# Patient Record
Sex: Female | Born: 1970 | Race: Black or African American | Hispanic: No | Smoking: Never smoker
Health system: Southern US, Community
[De-identification: ages and names within clinical notes are randomized; demographics above are authoritative.]

## PROBLEM LIST (undated history)

## (undated) DIAGNOSIS — D649 Anemia, unspecified: Secondary | ICD-10-CM

---

## 2007-05-06 ENCOUNTER — Encounter: Admission: RE | Admit: 2007-05-06 | Discharge: 2007-05-06 | Payer: Self-pay | Admitting: Obstetrics and Gynecology

## 2007-05-12 ENCOUNTER — Encounter: Admission: RE | Admit: 2007-05-12 | Discharge: 2007-05-12 | Payer: Self-pay | Admitting: Interventional Radiology

## 2007-07-14 ENCOUNTER — Ambulatory Visit (HOSPITAL_COMMUNITY): Admission: RE | Admit: 2007-07-14 | Discharge: 2007-07-15 | Payer: Self-pay | Admitting: Interventional Radiology

## 2007-07-29 ENCOUNTER — Encounter: Admission: RE | Admit: 2007-07-29 | Discharge: 2007-07-29 | Payer: Self-pay | Admitting: Interventional Radiology

## 2008-02-02 ENCOUNTER — Encounter: Admission: RE | Admit: 2008-02-02 | Discharge: 2008-02-02 | Payer: Self-pay | Admitting: Interventional Radiology

## 2010-03-18 ENCOUNTER — Encounter: Payer: Self-pay | Admitting: Specialist

## 2010-03-18 ENCOUNTER — Encounter: Payer: Self-pay | Admitting: Interventional Radiology

## 2010-11-21 LAB — CBC
MCHC: 33.7
MCV: 92.4
Platelets: 254
RBC: 3.82 — ABNORMAL LOW
RDW: 14.5

## 2010-11-21 LAB — CREATININE, SERUM: GFR calc Af Amer: >60

## 2011-12-24 ENCOUNTER — Emergency Department (HOSPITAL_COMMUNITY)
Admission: EM | Admit: 2011-12-24 | Discharge: 2011-12-25 | Disposition: A | Discharge status: Home / Self Care | Payer: 59 | Attending: Emergency Medicine | Admitting: Emergency Medicine

## 2011-12-24 DIAGNOSIS — Z862 Personal history of diseases of the blood and blood-forming organs and certain disorders involving the immune mechanism: Secondary | ICD-10-CM | POA: Insufficient documentation

## 2011-12-24 DIAGNOSIS — R51 Headache: Secondary | ICD-10-CM

## 2011-12-24 DIAGNOSIS — R202 Paresthesia of skin: Secondary | ICD-10-CM

## 2011-12-24 DIAGNOSIS — R209 Unspecified disturbances of skin sensation: Secondary | ICD-10-CM | POA: Insufficient documentation

## 2011-12-24 HISTORY — DX: Anemia, unspecified: D64.9

## 2011-12-25 ENCOUNTER — Emergency Department (HOSPITAL_COMMUNITY): Payer: 59

## 2011-12-25 ENCOUNTER — Encounter (HOSPITAL_COMMUNITY): Payer: Self-pay | Admitting: *Deleted

## 2011-12-25 MED ORDER — SODIUM CHLORIDE 0.9 % IV BOLUS (SEPSIS)
1000.0000 mL | Freq: Once | INTRAVENOUS | Status: AC
  Administered 2011-12-25: 1000 mL via INTRAVENOUS

## 2011-12-25 MED ORDER — METHYLPREDNISOLONE SODIUM SUCC 125 MG IJ SOLR
125.0000 mg | Freq: Once | INTRAMUSCULAR | Status: AC
  Administered 2011-12-25: 125 mg via INTRAVENOUS
  Filled 2011-12-25: qty 2

## 2011-12-25 MED ORDER — DIPHENHYDRAMINE HCL 50 MG/ML IJ SOLN
25.0000 mg | Freq: Once | INTRAMUSCULAR | Status: AC
  Administered 2011-12-25: 25 mg via INTRAVENOUS
  Filled 2011-12-25: qty 1

## 2011-12-25 MED ORDER — METHYLPREDNISOLONE 4 MG PO KIT: PACK | ORAL | Status: AC

## 2011-12-25 MED ORDER — KETOROLAC TROMETHAMINE 30 MG/ML IJ SOLN
30.0000 mg | Freq: Once | INTRAMUSCULAR | Status: AC
  Administered 2011-12-25: 30 mg via INTRAVENOUS
  Filled 2011-12-25: qty 1

## 2011-12-25 NOTE — ED Notes (Signed)
MD at bedside. 

## 2011-12-25 NOTE — ED Provider Notes (Signed)
History     CSN: [id number]  Arrival date & time 12/24/11  2350   First MD Initiated Contact with Patient 12/25/11 0029      Chief Complaint  Patient presents with  . Headache    (Consider location/radiation/quality/duration/timing/severity/associated sxs/prior treatment) HPI Hx per PT. HA x 2 days with on and off R side tingling to hand, but also somewhat to her face and foot on the right, has h/o carpal tunnel, no neck pain, no trouble walking or speaking, no h/o MS. This tingling comes and goes multiple times throughout the last few days. No h/o same. No recent lifting or more strenuous activity. No rash, weakness, or fevers.  Mod in severity.  HA located all over. Dull in quality, 5-6/10 at its worse minimal now. PT had her BP checked at work, was told it was high and referred here for further evaluation, is followed by [facility] in [facility], has never had elevated BP, she is very worried about this.   Past Medical History  Diagnosis Date  . Anemia     History reviewed. No pertinent past surgical history.  No family history on file.  History  Substance Use Topics  . Smoking status: Never Smoker   . Smokeless tobacco: Not on file  . Alcohol Use: No    OB History    Grav Para Term Preterm Abortions TAB SAB Ect Mult Living                  Review of Systems  Constitutional: Negative for fever and chills.  HENT: Negative for neck pain and neck stiffness.   Eyes: Negative for visual disturbance.  Respiratory: Negative for shortness of breath.   Cardiovascular: Negative for chest pain.  Gastrointestinal: Negative for abdominal pain.  Genitourinary: Negative for dysuria.  Musculoskeletal: Negative for back pain.  Skin: Negative for rash.  Neurological: Positive for numbness and headaches. Negative for seizures and speech difficulty.  All other systems reviewed and are negative.    Allergies  Aspirin  Home Medications   Current Outpatient Rx  Name Route Sig  Dispense Refill  . ZOLPIDEM TARTRATE 10 MG PO TABS Oral Take 10 mg by mouth at bedtime as needed. For sleep      BP 159/102  Pulse 86  Temp 98 F (36.7 C)  Resp 20  SpO2 100%  LMP 12/22/2011  Physical Exam  Constitutional: She is oriented to person, place, and time. She appears well-developed and well-nourished.  HENT:  Head: Normocephalic and atraumatic.  Eyes: Conjunctivae normal and EOM are normal. Pupils are equal, round, and reactive to light.  Neck: Full passive range of motion without pain. Neck supple. No thyromegaly present.       No midline tenderness or meningismus  Cardiovascular: Normal rate, regular rhythm, S1 normal, S2 normal and intact distal pulses.   Pulmonary/Chest: Effort normal and breath sounds normal.  Abdominal: Soft. Bowel sounds are normal. There is no tenderness. There is no CVA tenderness.  Musculoskeletal: Normal range of motion.       No C/T/L spine tenderness  Neurological: She is alert and oriented to person, place, and time. She has normal strength and normal reflexes. She displays normal reflexes. No cranial nerve deficit or sensory deficit. She exhibits normal muscle tone. She displays a negative Romberg sign. Coordination normal. GCS eye subscore is 4. GCS verbal subscore is 5. GCS motor subscore is 6.       Normal Gait. Sensorium to light touch equal and  intact throughout.   Skin: Skin is warm and dry. No rash noted. No cyanosis. Nails show no clubbing.  Psychiatric: She has a normal mood and affect. Her speech is normal and behavior is normal.    ED Course  Procedures (including critical care time)  Ct Head Wo Contrast  12/25/2011  *RADIOLOGY REPORT*  Clinical Data: Headache.  Tingling on the right side of her body.  CT HEAD WITHOUT CONTRAST  Technique:  Contiguous axial images were obtained from the base of the skull through the vertex without contrast.  Comparison: None.  Findings: No acute intracranial abnormality is present. Specifically,  there is no evidence for acute infarct, hemorrhage, mass, hydrocephalus, or extra-axial fluid collection.  The paranasal sinuses and mastoid air cells are clear.  The globes and orbits are intact.  The osseous skull is intact.  IMPRESSION: Negative CT of the head.   Original Report Authenticated By: Jamesetta Orleans. MATTERN, M.D.    IVFs. IV toradol for HA  On recheck mild itching and erythematous rash - was given steroids and benadryl  3:28 AM HA and rash and itching resolved.  CT reviewed. No change normal neuro exam. BP unchanged. Plan close PCP follow up for recheck BP and symptoms. Presentation does not suggest stroke. Intermittent symptoms with tingling mostly R hand more c/w carpal tunnel.    MDM   HA and elevated BP. CT scan. VS and nursing notes reviewed. IVFs and toradol for HA. Mild allergic reaction to toradol  VS and nursing notes reviewed. Improved condition - stable for d/c home and outpatient follow up      Sunnie Nielsen, MD 12/27/11 0116

## 2011-12-25 NOTE — ED Notes (Signed)
Pt reports hx of carpal tunnel syndrome in past.

## 2011-12-25 NOTE — ED Notes (Signed)
Pt reported having red bumps on the side of her face.  Upon inspection, pt did have small red bumps.  Pt report itching.  MD made aware and pt given medication.

## 2011-12-25 NOTE — ED Notes (Signed)
Pt c/o dull headache x 2 days; tingling sensation on right side of body x 2 days

## 2016-02-02 ENCOUNTER — Ambulatory Visit (INDEPENDENT_AMBULATORY_CARE_PROVIDER_SITE_OTHER): Payer: 59

## 2016-02-02 ENCOUNTER — Ambulatory Visit (INDEPENDENT_AMBULATORY_CARE_PROVIDER_SITE_OTHER): Payer: 59 | Admitting: Podiatry

## 2016-02-02 DIAGNOSIS — M7662 Achilles tendinitis, left leg: Secondary | ICD-10-CM

## 2016-02-02 DIAGNOSIS — M722 Plantar fascial fibromatosis: Secondary | ICD-10-CM

## 2016-02-02 MED ORDER — DICLOFENAC SODIUM 75 MG PO TBEC: 75.0000 mg | DELAYED_RELEASE_TABLET | Freq: Two times a day (BID) | ORAL | 2 refills | Status: AC

## 2016-02-02 NOTE — Patient Instructions (Signed)

## 2016-02-02 NOTE — Progress Notes (Signed)
   Subjective:    Patient ID: Margaret Wilson, female    DOB: 02/02/1971, 45 y.o.   MRN: [id number]  HPI    Review of Systems  All other systems reviewed and are negative.      Objective:   Physical Exam        Assessment & Plan:

## 2016-02-04 NOTE — Progress Notes (Signed)
Subjective:     Patient ID: Margaret Wilson, female   DOB: 05/31/1970, 45 y.o.   MRN: [id number]  HPI patient presents with 2 problems with one being discomfort in the right plantar arch mid arch area and on the left there is a small area of reactivity on the posterior aspect left heel that is not tender but she's concerned about appearance   Review of Systems  All other systems reviewed and are negative.      Objective:   Physical Exam  Constitutional: She is oriented to person, place, and time.  Cardiovascular: Intact distal pulses.   Musculoskeletal: Normal range of motion.  Neurological: She is oriented to person, place, and time.  Skin: Skin is warm.  Nursing note and vitals reviewed.  neurovascular status intact muscle strength adequate range of motion within normal limits with patient found to have discoloration of the left posterior heel within the insertion point of the calcaneus lateral side and pain in the right mid arch area with moderate depression of the arch noted. Patient's found have good digital perfusion and is well oriented 3     Assessment:     Reactivity left posterior heel which may be related to spur but not painful and fasciitis right    Plan:     H&P conditions reviewed and at this time reviewed x-rays. I then do not recommend treatment left except for silicone padding which was dispensed and for the right I dispensed fascial brace to lift the arch with instructions for physical therapy. If symptoms were to get worse we will consider treatment but I do not recommend surgery for the left and less it were to become painful  X-ray report indicated that there is spurring of the posterior left heel for mild nature and mild depression of the arch with no signs stress fracture arthritis

## 2017-01-08 ENCOUNTER — Other Ambulatory Visit: Payer: Self-pay | Admitting: Family Medicine

## 2017-01-08 DIAGNOSIS — Z139 Encounter for screening, unspecified: Secondary | ICD-10-CM

## 2017-02-11 ENCOUNTER — Ambulatory Visit
Admission: RE | Admit: 2017-02-11 | Discharge: 2017-02-11 | Disposition: A | Discharge status: Home / Self Care | Payer: 59 | Source: Ambulatory Visit | Attending: Family Medicine | Admitting: Family Medicine

## 2017-02-11 DIAGNOSIS — Z139 Encounter for screening, unspecified: Secondary | ICD-10-CM

## 2018-04-01 ENCOUNTER — Other Ambulatory Visit: Payer: Self-pay | Admitting: Family Medicine

## 2018-04-01 DIAGNOSIS — Z1231 Encounter for screening mammogram for malignant neoplasm of breast: Secondary | ICD-10-CM

## 2018-04-28 ENCOUNTER — Ambulatory Visit
Admission: RE | Admit: 2018-04-28 | Discharge: 2018-04-28 | Disposition: A | Discharge status: Home / Self Care | Payer: 59 | Source: Ambulatory Visit | Attending: Family Medicine | Admitting: Family Medicine

## 2018-04-28 ENCOUNTER — Encounter: Payer: Self-pay | Admitting: Radiology

## 2018-04-28 DIAGNOSIS — Z1231 Encounter for screening mammogram for malignant neoplasm of breast: Secondary | ICD-10-CM

## 2019-03-01 ENCOUNTER — Ambulatory Visit: Payer: 59 | Attending: Internal Medicine

## 2019-03-01 DIAGNOSIS — Z20822 Contact with and (suspected) exposure to covid-19: Secondary | ICD-10-CM

## 2019-03-02 LAB — NOVEL CORONAVIRUS, NAA: SARS-CoV-2, NAA: DETECTED — AB

## 2019-03-23 ENCOUNTER — Other Ambulatory Visit: Payer: Self-pay | Admitting: Family Medicine

## 2019-03-23 DIAGNOSIS — Z1231 Encounter for screening mammogram for malignant neoplasm of breast: Secondary | ICD-10-CM

## 2019-04-30 ENCOUNTER — Other Ambulatory Visit: Payer: Self-pay

## 2019-04-30 ENCOUNTER — Ambulatory Visit
Admission: RE | Admit: 2019-04-30 | Discharge: 2019-04-30 | Disposition: A | Discharge status: Home / Self Care | Payer: 59 | Source: Ambulatory Visit | Attending: Family Medicine | Admitting: Family Medicine

## 2019-04-30 DIAGNOSIS — Z1231 Encounter for screening mammogram for malignant neoplasm of breast: Secondary | ICD-10-CM

## 2020-03-28 ENCOUNTER — Other Ambulatory Visit: Payer: Self-pay | Admitting: Family Medicine

## 2020-03-28 DIAGNOSIS — Z1231 Encounter for screening mammogram for malignant neoplasm of breast: Secondary | ICD-10-CM

## 2020-05-11 ENCOUNTER — Inpatient Hospital Stay: Admission: RE | Admit: 2020-05-11 | Payer: 59 | Source: Ambulatory Visit

## 2020-06-30 ENCOUNTER — Ambulatory Visit
Admission: RE | Admit: 2020-06-30 | Discharge: 2020-06-30 | Disposition: A | Discharge status: Home / Self Care | Payer: 59 | Source: Ambulatory Visit | Attending: Family Medicine | Admitting: Family Medicine

## 2020-06-30 ENCOUNTER — Other Ambulatory Visit: Payer: Self-pay

## 2020-06-30 DIAGNOSIS — Z1231 Encounter for screening mammogram for malignant neoplasm of breast: Secondary | ICD-10-CM

## 2020-07-03 ENCOUNTER — Other Ambulatory Visit: Payer: Self-pay | Admitting: Family Medicine

## 2020-07-03 DIAGNOSIS — R928 Other abnormal and inconclusive findings on diagnostic imaging of breast: Secondary | ICD-10-CM

## 2020-07-21 ENCOUNTER — Ambulatory Visit: Payer: 59

## 2020-07-21 ENCOUNTER — Other Ambulatory Visit: Payer: Self-pay

## 2020-07-21 ENCOUNTER — Ambulatory Visit
Admission: RE | Admit: 2020-07-21 | Discharge: 2020-07-21 | Disposition: A | Discharge status: Home / Self Care | Payer: 59 | Source: Ambulatory Visit | Attending: Family Medicine | Admitting: Family Medicine

## 2020-07-21 DIAGNOSIS — R928 Other abnormal and inconclusive findings on diagnostic imaging of breast: Secondary | ICD-10-CM

## 2021-06-21 ENCOUNTER — Other Ambulatory Visit: Payer: Self-pay | Admitting: Family Medicine

## 2021-06-21 DIAGNOSIS — Z1231 Encounter for screening mammogram for malignant neoplasm of breast: Secondary | ICD-10-CM

## 2021-07-02 ENCOUNTER — Ambulatory Visit
Admission: RE | Admit: 2021-07-02 | Discharge: 2021-07-02 | Disposition: A | Discharge status: Home / Self Care | Payer: 59 | Source: Ambulatory Visit | Attending: Family Medicine | Admitting: Family Medicine

## 2021-07-02 DIAGNOSIS — Z1231 Encounter for screening mammogram for malignant neoplasm of breast: Secondary | ICD-10-CM

## 2022-02-18 ENCOUNTER — Other Ambulatory Visit: Payer: Self-pay

## 2022-02-18 ENCOUNTER — Emergency Department (HOSPITAL_BASED_OUTPATIENT_CLINIC_OR_DEPARTMENT_OTHER)
Admission: EM | Admit: 2022-02-18 | Discharge: 2022-02-18 | Disposition: A | Discharge status: Home / Self Care | Payer: 59 | Attending: Emergency Medicine | Admitting: Emergency Medicine

## 2022-02-18 DIAGNOSIS — J101 Influenza due to other identified influenza virus with other respiratory manifestations: Secondary | ICD-10-CM | POA: Diagnosis not present

## 2022-02-18 DIAGNOSIS — D72810 Lymphocytopenia: Secondary | ICD-10-CM | POA: Diagnosis not present

## 2022-02-18 DIAGNOSIS — R55 Syncope and collapse: Secondary | ICD-10-CM

## 2022-02-18 DIAGNOSIS — I10 Essential (primary) hypertension: Secondary | ICD-10-CM | POA: Diagnosis not present

## 2022-02-18 DIAGNOSIS — U071 COVID-19: Secondary | ICD-10-CM | POA: Insufficient documentation

## 2022-02-18 LAB — CBC
HCT: 34.2 % — ABNORMAL LOW (ref 36.0–46.0)
Hemoglobin: 11.4 g/dL — ABNORMAL LOW (ref 12.0–15.0)
MCH: 30.7 pg (ref 26.0–34.0)
MCHC: 33.3 g/dL (ref 30.0–36.0)
MCV: 92.2 fL (ref 80.0–100.0)
Platelets: 179 10*3/uL (ref 150–400)
RBC: 3.71 MIL/uL — ABNORMAL LOW (ref 3.87–5.11)
RDW: 13.1 % (ref 11.5–15.5)
WBC: 2.9 10*3/uL — ABNORMAL LOW (ref 4.0–10.5)
nRBC: 0 % (ref 0.0–0.2)

## 2022-02-18 LAB — URINALYSIS, ROUTINE W REFLEX MICROSCOPIC
Bilirubin Urine: NEGATIVE
Glucose, UA: NEGATIVE mg/dL
Hgb urine dipstick: NEGATIVE
Ketones, ur: NEGATIVE mg/dL
Leukocytes,Ua: NEGATIVE
Nitrite: NEGATIVE
Protein, ur: NEGATIVE mg/dL
Specific Gravity, Urine: 1.017 (ref 1.005–1.030)
pH: 6.5 (ref 5.0–8.0)

## 2022-02-18 LAB — COMPREHENSIVE METABOLIC PANEL
ALT: 20 U/L (ref 0–44)
AST: 26 U/L (ref 15–41)
Albumin: 4 g/dL (ref 3.5–5.0)
Alkaline Phosphatase: 39 U/L (ref 38–126)
Anion gap: 10 (ref 5–15)
BUN: 12 mg/dL (ref 6–20)
CO2: 28 mmol/L (ref 22–32)
Calcium: 8.5 mg/dL — ABNORMAL LOW (ref 8.9–10.3)
Chloride: 104 mmol/L (ref 98–111)
Creatinine, Ser: 0.65 mg/dL (ref 0.44–1.00)
GFR, Estimated: >60 mL/min (ref >60)
Glucose, Bld: 92 mg/dL (ref 70–99)
Potassium: 3.4 mmol/L — ABNORMAL LOW (ref 3.5–5.1)
Sodium: 142 mmol/L (ref 135–145)
Total Bilirubin: 0.3 mg/dL (ref 0.3–1.2)
Total Protein: 6.9 g/dL (ref 6.5–8.1)

## 2022-02-18 LAB — RESP PANEL BY RT-PCR (RSV, FLU A&B, COVID)  RVPGX2
Influenza A by PCR: POSITIVE — AB
Influenza B by PCR: NEGATIVE
Resp Syncytial Virus by PCR: NEGATIVE
SARS Coronavirus 2 by RT PCR: POSITIVE — AB

## 2022-02-18 LAB — TROPONIN I (HIGH SENSITIVITY): Troponin I (High Sensitivity): <2 ng/L (ref <18)

## 2022-02-18 MED ORDER — LACTATED RINGERS IV BOLUS
1000.0000 mL | Freq: Once | INTRAVENOUS | Status: AC
  Administered 2022-02-18: 1000 mL via INTRAVENOUS

## 2022-02-18 MED ORDER — KETOROLAC TROMETHAMINE 15 MG/ML IJ SOLN
15.0000 mg | Freq: Once | INTRAMUSCULAR | Status: AC
  Administered 2022-02-18: 15 mg via INTRAVENOUS
  Filled 2022-02-18: qty 1

## 2022-02-18 MED ORDER — ONDANSETRON HCL 4 MG/2ML IJ SOLN: 4.0000 mg | Freq: Once | INTRAMUSCULAR | Status: DC

## 2022-02-18 MED ORDER — POTASSIUM CHLORIDE CRYS ER 20 MEQ PO TBCR
40.0000 meq | EXTENDED_RELEASE_TABLET | Freq: Once | ORAL | Status: AC
  Administered 2022-02-18: 40 meq via ORAL
  Filled 2022-02-18: qty 2

## 2022-02-18 NOTE — ED Provider Notes (Signed)
[facility] EMERGENCY DEPT Provider Note   CSN: [id number] Arrival date & time: 02/18/22  1572     History  Chief Complaint  Patient presents with   Loss of Consciousness   Hypotension    Margaret Wilson is a 51 y.o. female.  With PMH of HTN, HLD, anemia who was brought in by EMS today after having a syncopal episode noted to have positive orthostats with EMS given 500 cc IVF.  Patient noted getting up this morning and walking around her home but then feeling very lightheaded and unwell and like she was going to faint and then fell to the ground.  Had short episode of loss of consciousness but no head injury.  She is denying any headache or neck pain now.  Denies any focal weakness, no numbness or tingling, no loss of sensation, no slurred speech.  She had no seizure-like activity witnessed by family member at bedside, no loss of bladder or bowels, no tongue bites.  She just notes over the past couple of days she has been having sore throat, dry cough, body aches and fevers.  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  She has had decreased p.o. intake in the setting of this illness.  She has had no vomiting or diarrhea, no melena or hematochezia.  No chest pain, no abdominal pain, no palpitations or shortness of breath..   Loss of Consciousness      Home Medications Prior to Admission medications   Medication Sig Start Date End Date Taking? Authorizing Provider  diclofenac (VOLTAREN) 75 MG EC tablet Take 1 tablet (75 mg total) by mouth 2 (two) times daily. 02/02/16   Lenn Sink, DPM  methylPREDNISolone (MEDROL DOSEPAK) 4 MG tablet follow package directions 12/25/11   Sunnie Nielsen, MD  zolpidem (AMBIEN) 10 MG tablet Take 10 mg by mouth at bedtime as needed. For sleep    [provider]      Allergies    Aspirin    Review of Systems   Review of Systems  Cardiovascular:   Positive for syncope.    Physical Exam Updated Vital Signs BP 133/87   Pulse 69   Temp 98.8 F (37.1 C) (Oral)   Resp 14   Ht 5\' 2"  (1.575 m)   Wt 54.4 kg   LMP 12/22/2011   SpO2 97%   BMI 21.95 kg/m  Physical Exam Constitutional: Alert and oriented. Well appearing and in no distress. Eyes: Conjunctivae are normal. ENT      Head: Normocephalic and atraumatic.      Nose: No congestion.      Mouth/Throat: Mucous membranes are moist.  No tongue bites      Neck: No stridor.  No midline tenderness step-offs or deformities Cardiovascular: S1, S2,  Normal and symmetric distal pulses are present in all extremities.Warm and well perfused. Respiratory: Normal respiratory effort. Breath sounds are normal.  O2 sat 100 on RA Gastrointestinal: Soft and nontender.  Musculoskeletal: Normal range of motion in all extremities.      Right lower leg: No tenderness or edema.      Left lower leg: No tenderness or edema. Neurologic: Normal speech and language.  CN II through XII grossly intact.  5 out of 5 strength bilateral upper and lower extremities.  Normal finger-to-nose.  Sensation grossly intact.  No gross focal neurologic deficits are appreciated. Skin: Skin is warm, dry and  intact. No rash noted. Psychiatric: Mood and affect are normal. Speech and behavior are normal.  ED Results / Procedures / Treatments   Labs (all labs ordered are listed, but only abnormal results are displayed) Labs Reviewed  RESP PANEL BY RT-PCR (RSV, FLU A&B, COVID)  RVPGX2 - Abnormal; Notable for the following components:      Result Value   SARS Coronavirus 2 by RT PCR POSITIVE (*)    Influenza A by PCR POSITIVE (*)    All other components within normal limits  CBC - Abnormal; Notable for the following components:   WBC 2.9 (*)    RBC 3.71 (*)    Hemoglobin 11.4 (*)    HCT 34.2 (*)    All other components within normal limits  COMPREHENSIVE METABOLIC PANEL - Abnormal; Notable for the following components:    Potassium 3.4 (*)    Calcium 8.5 (*)    All other components within normal limits  URINALYSIS, ROUTINE W REFLEX MICROSCOPIC  CBG MONITORING, ED  TROPONIN I (HIGH SENSITIVITY)    EKG EKG Interpretation  Date/Time:  Monday February 18 2022 08:39:54 EST Ventricular Rate:  68 PR Interval:  149 QRS Duration: 84 QT Interval:  375 QTC Calculation: 399 R Axis:   63 Text Interpretation: Sinus rhythm Low voltage, precordial leads Probable anteroseptal infarct, old Confirmed by Vivien Rossetti ([id number]) on 02/18/2022 8:47:04 AM  Radiology No results found.  Procedures Procedures  Remain on constant cardiac monitoring normal sinus rhythm with normal rates.  Medications Ordered in ED Medications  potassium chloride SA (KLOR-CON M) CR tablet 40 mEq (has no administration in time range)  lactated ringers bolus 1,000 mL (0 mLs Intravenous Stopped 02/18/22 1100)  ketorolac (TORADOL) 15 MG/ML injection 15 mg (15 mg Intravenous Given 02/18/22 8299)    ED Course/ Medical Decision Making/ A&P                           Medical Decision Making Laquenta P Howser is a 51 y.o. female.  With PMH of HTN, HLD, anemia who was brought in by EMS today after having a syncopal episode noted to have positive orthostats with EMS given 500 cc IVF.  Patient noted getting up this morning and walking around her home but then feeling very lightheaded and unwell and like she was going to faint and then fell to the ground.  Based on patient's history and presentation with decreased p.o. intake in the setting of suspected viral illness, presentation seems most consistent with orthostatic episode and nonconcerning cause of syncope.  She additionally had positive orthostats with EMS and feels improved with IV fluids.  She had no head injury and has had no focal neurologic deficits on exam and no confusion.  No tongue bites no seizure-like activity no loss of bladder or bowels, do not think she had a seizure  today.  CT Head Unnecessary The [place] Head CT Rule suggests a head CT is not necessary for this patient (sensitivity 83-100% for all intracranial traumatic findings, sensitivity 100% for findings requiring neurosurgical intervention).  EKG obtained which I personally reviewed which was normal sinus rhythm with normal rate normal QTc 399 and no ARVD or WPW.  Troponin undetected, unlikely ACS.  Especially with no cardiopulmonary symptoms.  Labs obtained which I personally reviewed.  Mild lymphopenia 2.9 likely in the setting of viral illnesses patient tested positive for both COVID and influenza today.  Her hemoglobin is 11.4 at baseline.  No complaints of bleeding.  Mild hypokalemia 3.4 repleted in ED.  Creatinine 0.65 within normal limits.  UA without evidence of dehydration or UTI.  Patient ambulated multiple times without any further episodes.  Her hemodynamics are stable and she has no hypoxia or increased work of breathing or findings concerning for severe COVID/flu infection requiring any further workup.  She feels safe with discharge home and advised continued p.o. hydration, Tylenol ibuprofen as needed for fevers or aches and close follow-up with PCP with return precautions.  She is in agreement with plan and safe for discharge.  Amount and/or Complexity of Data Reviewed Labs: ordered.  Risk Prescription drug management.    Final Clinical Impression(s) / ED Diagnoses Final diagnoses:  Syncope, unspecified syncope type  COVID-19  Influenza A    Rx / DC Orders ED Discharge Orders     None         Mardene Sayer, MD 02/18/22 1116

## 2022-02-18 NOTE — ED Triage Notes (Signed)
Patient arrives via EMS from home with complaints of having a syncopal episode. The was patient was walking around her home, when she fainted. EMS arrived to her home and she was alert and oriented x4, but having orthostatic changes in her blood pressure.   Patient initial bp was 106/70, but decreased to 84/60. When ambulating.   CBG 132 70 HR (Normal sinus).  Given 500 CC of IV fluid en route.  Reports some back pain on arrival. Rates pain a 4/10.

## 2022-02-18 NOTE — Discharge Instructions (Addendum)
You have been seen today in the Emergency Department (ED)  for syncope (passing out).  Your workup including labs and EKG show reassuring results.  Your symptoms may be due to dehydration, so it is important that you drink plenty of non-alcoholic fluids. It is also important to avoid drugs and alcohol.  You also tested positive for influenza and COVID which are contributing to your symptoms and feeling unwell.  Take Tylenol and ibuprofen as needed for pain or fever.  Please call your regular doctor as soon as possible to schedule the next available clinic appointment to follow up with him/her regarding your visit to the ED and your symptoms. You should ideally follow up with your primary doctor within one week.  Return to the Emergency Department (ED)  if you have any further syncopal episodes (pass out again) or develop ANY chest pain, pressure, tightness, palpitations, trouble breathing, sudden sweating, or other symptoms that concern you.

## 2022-05-20 ENCOUNTER — Other Ambulatory Visit: Payer: Self-pay | Admitting: Family Medicine

## 2022-05-20 DIAGNOSIS — Z1231 Encounter for screening mammogram for malignant neoplasm of breast: Secondary | ICD-10-CM

## 2022-07-04 ENCOUNTER — Ambulatory Visit
Admission: RE | Admit: 2022-07-04 | Discharge: 2022-07-04 | Disposition: A | Discharge status: Home / Self Care | Payer: 59 | Source: Ambulatory Visit | Attending: Family Medicine | Admitting: Family Medicine

## 2022-07-04 DIAGNOSIS — Z1231 Encounter for screening mammogram for malignant neoplasm of breast: Secondary | ICD-10-CM

## 2023-05-22 ENCOUNTER — Other Ambulatory Visit: Payer: Self-pay | Admitting: Family Medicine

## 2023-05-22 DIAGNOSIS — Z0000 Encounter for general adult medical examination without abnormal findings: Secondary | ICD-10-CM

## 2023-07-08 ENCOUNTER — Ambulatory Visit
Admission: RE | Admit: 2023-07-08 | Discharge: 2023-07-08 | Disposition: A | Discharge status: Home / Self Care | Source: Ambulatory Visit | Attending: Family Medicine | Admitting: Family Medicine

## 2023-07-08 DIAGNOSIS — Z0000 Encounter for general adult medical examination without abnormal findings: Secondary | ICD-10-CM

## 2023-08-28 IMAGING — MG MM DIGITAL SCREENING BILAT W/ TOMO AND CAD
8 series · 9 of 24 positions shown · non-contrast
Comparison: Previous exam(s).

CLINICAL DATA: Screening.

EXAM:
DIGITAL SCREENING BILATERAL MAMMOGRAM WITH TOMOSYNTHESIS AND CAD
TECHNIQUE: Bilateral screening digital craniocaudal and mediolateral oblique
mammograms were obtained. Bilateral screening digital breast
tomosynthesis was performed. The images were evaluated with
computer-aided detection.

[R MLO synth-2D]
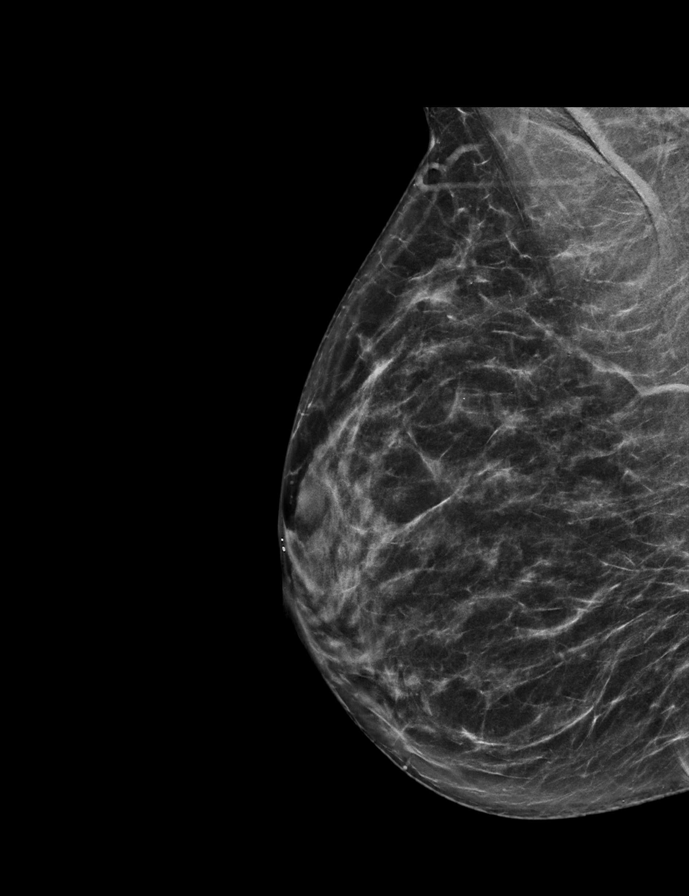

[L MLO synth-2D]
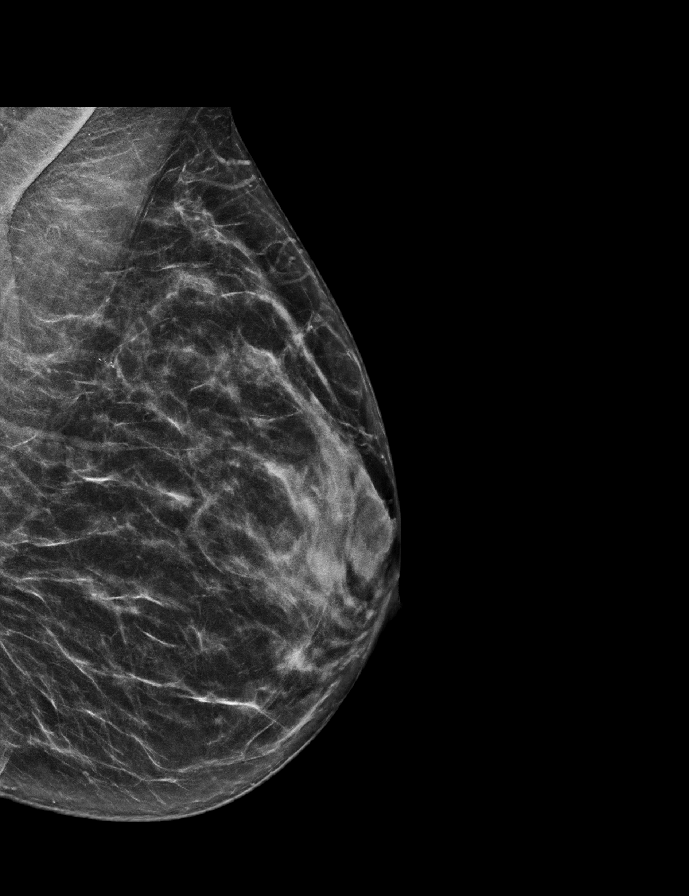

[L CC synth-2D]
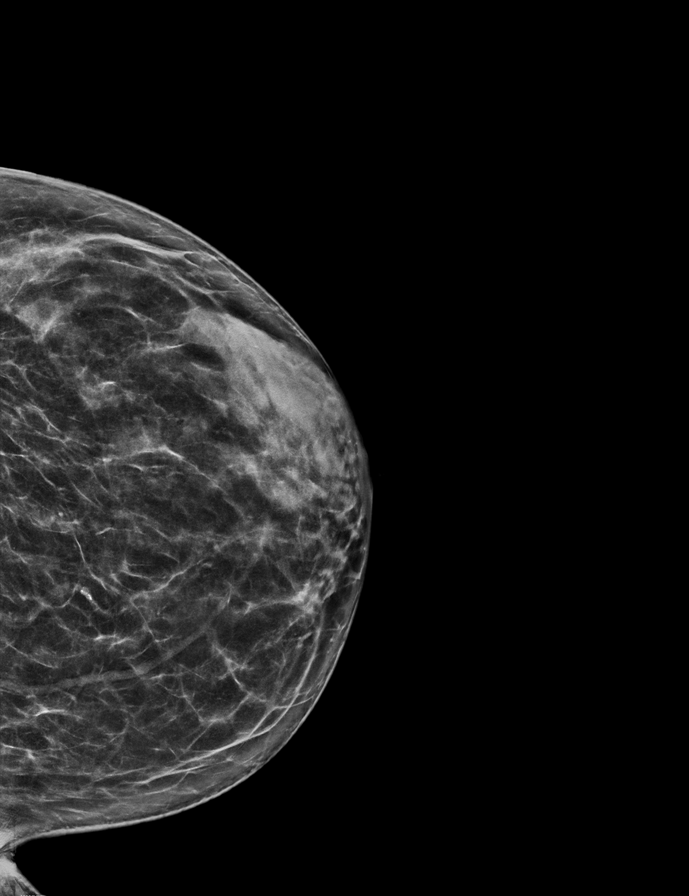

[R CC synth-2D]
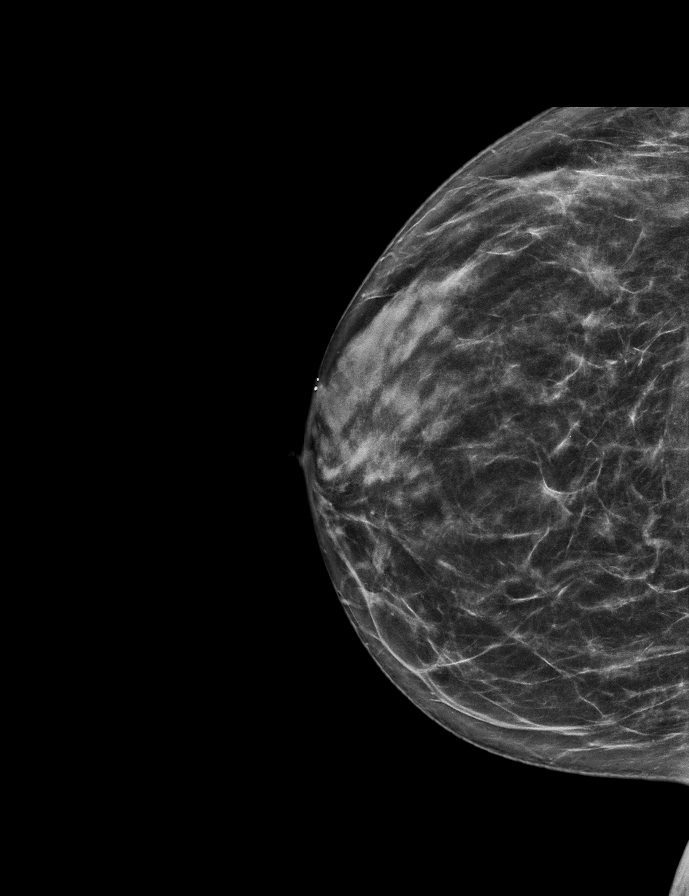

[R CC tomo · 2 of 61 frames shown]
[frame 20/61]
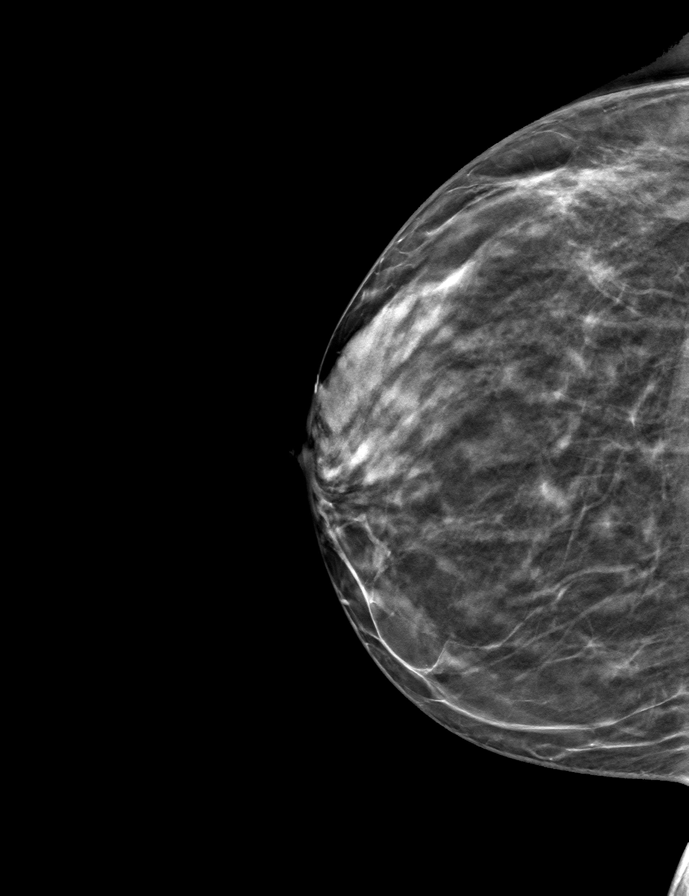
[frame 31/61]
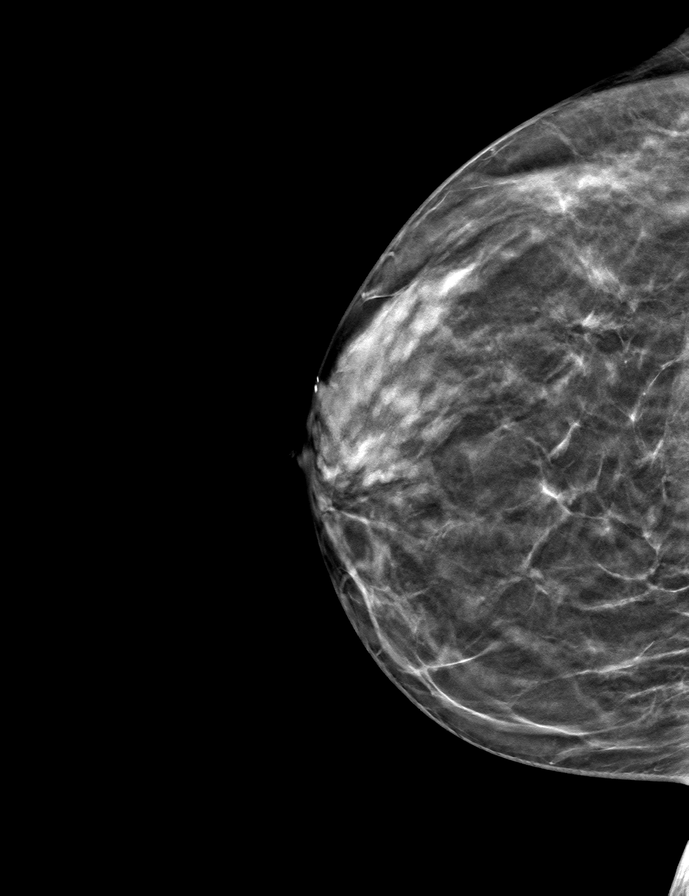

[L MLO tomo · tomo slice 31/62.0]
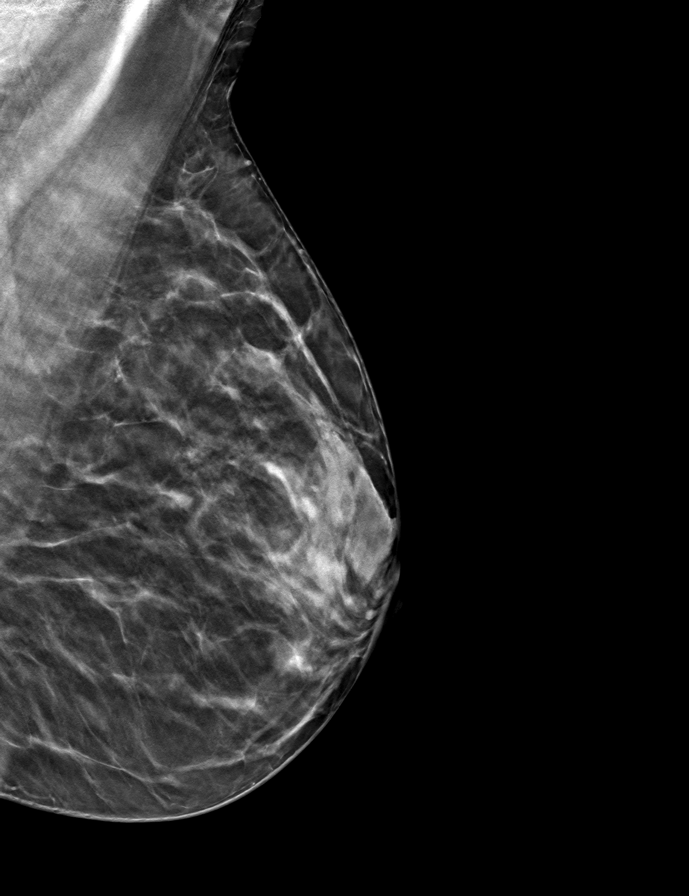

[R MLO tomo · tomo slice 31/61.0]
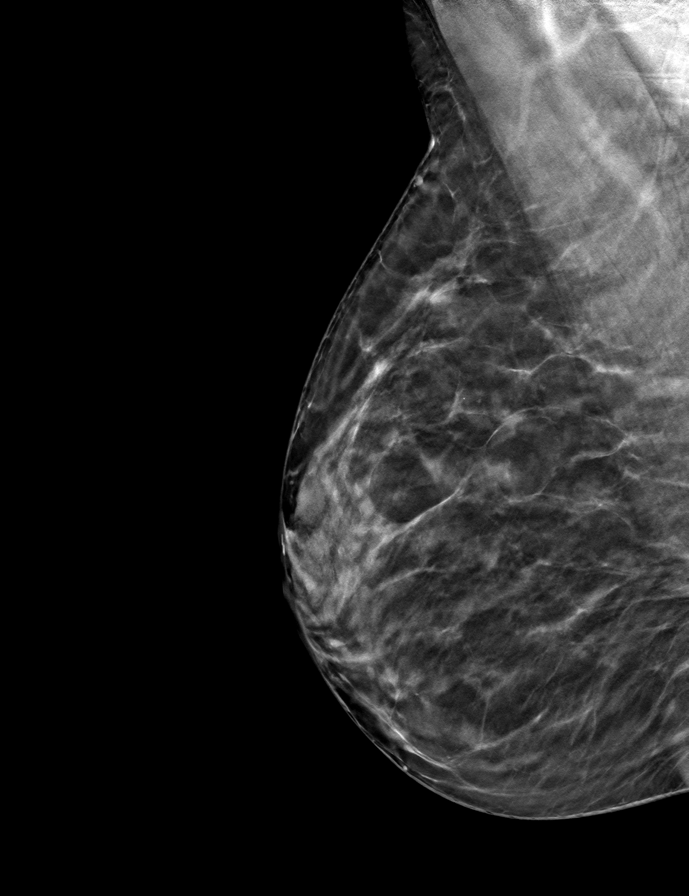

[L CC tomo · tomo slice 33/65.0]
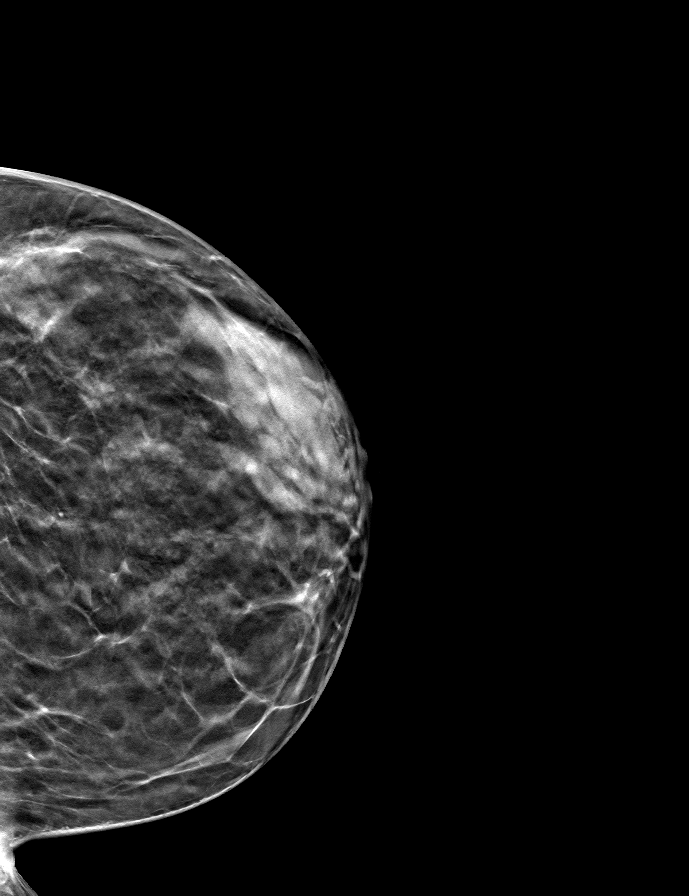

[9 of 24 positions shown; findings below may reference images not displayed]

ACR Breast Density Category b: There are scattered areas of
fibroglandular density.
FINDINGS: There are no findings suspicious for malignancy.
IMPRESSION: No mammographic evidence of malignancy. A result letter of this
screening mammogram will be mailed directly to the patient.

RECOMMENDATION:
Screening mammogram in one year. (Code:51-O-LD2)

BI-RADS CATEGORY  1: Negative.
